# Patient Record
Sex: Female | Born: 1950 | Race: White | Hispanic: No | Marital: Married | Smoking: Current some day smoker
Health system: Southern US, Community
[De-identification: ages and names within clinical notes are randomized; demographics above are authoritative.]

## PROBLEM LIST (undated history)

## (undated) DIAGNOSIS — I10 Essential (primary) hypertension: Secondary | ICD-10-CM

## (undated) DIAGNOSIS — I480 Paroxysmal atrial fibrillation: Secondary | ICD-10-CM

## (undated) DIAGNOSIS — C50919 Malignant neoplasm of unspecified site of unspecified female breast: Secondary | ICD-10-CM

## (undated) HISTORY — PX: ANKLE SURGERY: SHX546

## (undated) HISTORY — PX: LAPAROSCOPIC TUBAL LIGATION: SHX1937

## (undated) HISTORY — PX: DILATION AND CURETTAGE OF UTERUS: SHX78

---

## 1998-11-17 ENCOUNTER — Other Ambulatory Visit: Admission: RE | Admit: 1998-11-17 | Discharge: 1998-11-17 | Payer: Self-pay | Admitting: Obstetrics and Gynecology

## 1999-11-29 ENCOUNTER — Other Ambulatory Visit: Admission: RE | Admit: 1999-11-29 | Discharge: 1999-11-29 | Payer: Self-pay | Admitting: Obstetrics and Gynecology

## 2000-12-06 ENCOUNTER — Other Ambulatory Visit: Admission: RE | Admit: 2000-12-06 | Discharge: 2000-12-06 | Payer: Self-pay | Admitting: *Deleted

## 2001-12-10 ENCOUNTER — Other Ambulatory Visit: Admission: RE | Admit: 2001-12-10 | Discharge: 2001-12-10 | Payer: Self-pay | Admitting: Obstetrics and Gynecology

## 2002-12-11 ENCOUNTER — Other Ambulatory Visit: Admission: RE | Admit: 2002-12-11 | Discharge: 2002-12-11 | Payer: Self-pay | Admitting: Obstetrics and Gynecology

## 2003-12-16 ENCOUNTER — Other Ambulatory Visit: Admission: RE | Admit: 2003-12-16 | Discharge: 2003-12-16 | Payer: Self-pay | Admitting: Obstetrics and Gynecology

## 2007-05-08 ENCOUNTER — Encounter: Admission: RE | Admit: 2007-05-08 | Discharge: 2007-05-08 | Payer: Self-pay | Admitting: Family Medicine

## 2007-05-14 ENCOUNTER — Encounter: Admission: RE | Admit: 2007-05-14 | Discharge: 2007-05-14 | Payer: Self-pay | Admitting: Family Medicine

## 2008-03-15 IMAGING — MG MM DIGITAL SCREENING BILAT W/ CAD
4 series · 4 of 4 positions shown · non-contrast
Comparison: none

DG SCREEN MAMMOGRAM BILATERAL
Bilateral CC and MLO view(s) were taken.
Technologist: Tae Hoon Junichi

DIGITAL SCREENING MAMMOGRAM WITH CAD:
There is a fibroglandular pattern.  A possible mass is noted in the left breast.  Spot compression 
views and possibly sonography are recommended for further evaluation.  In the right breast, no 
masses or malignant type calcifications are identified.  Compared with prior studies.

[R CC]
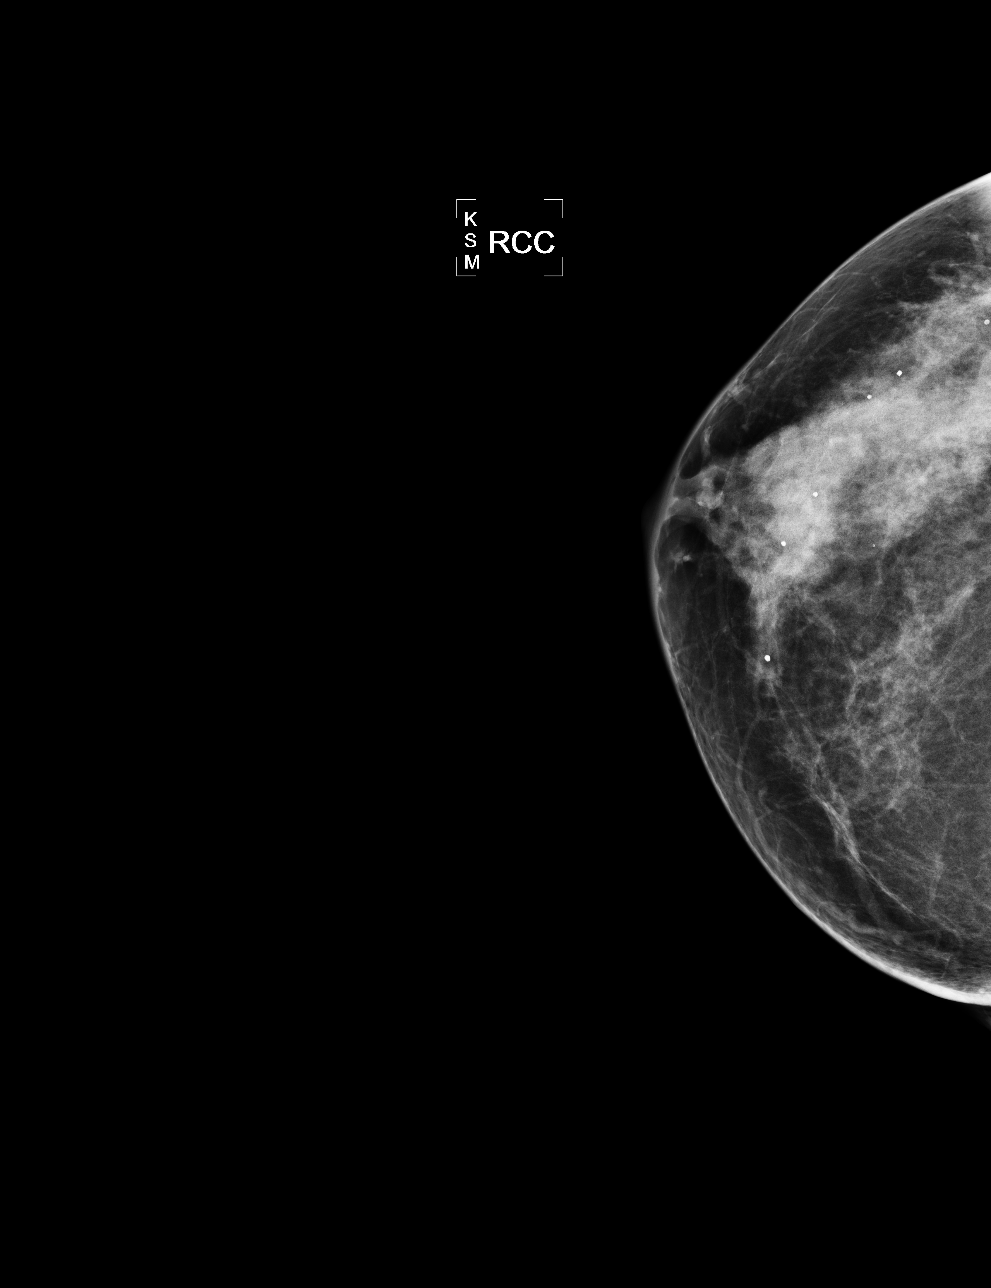

[L CC]
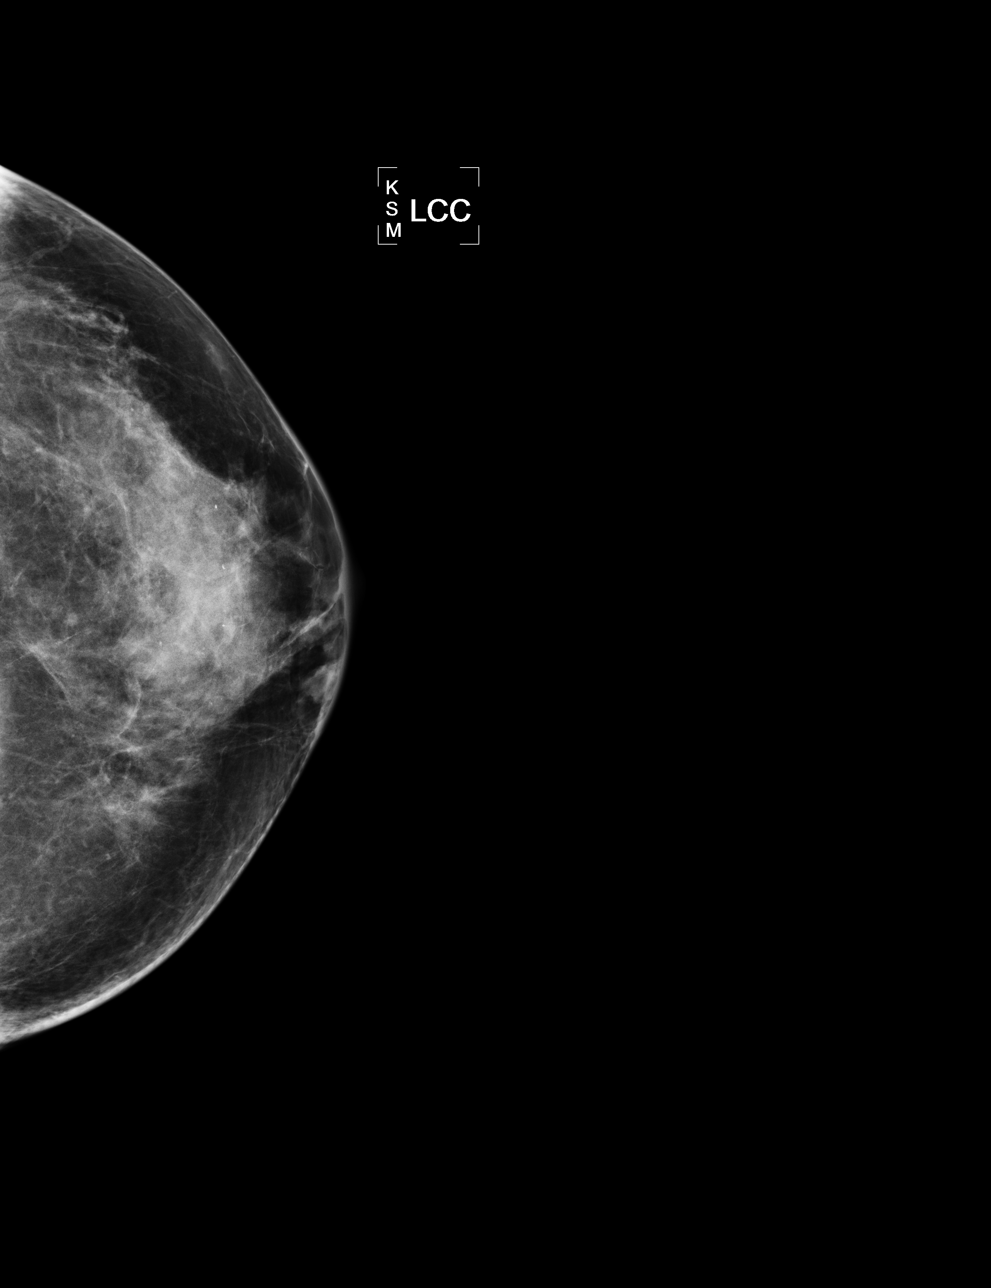

[L MLO]
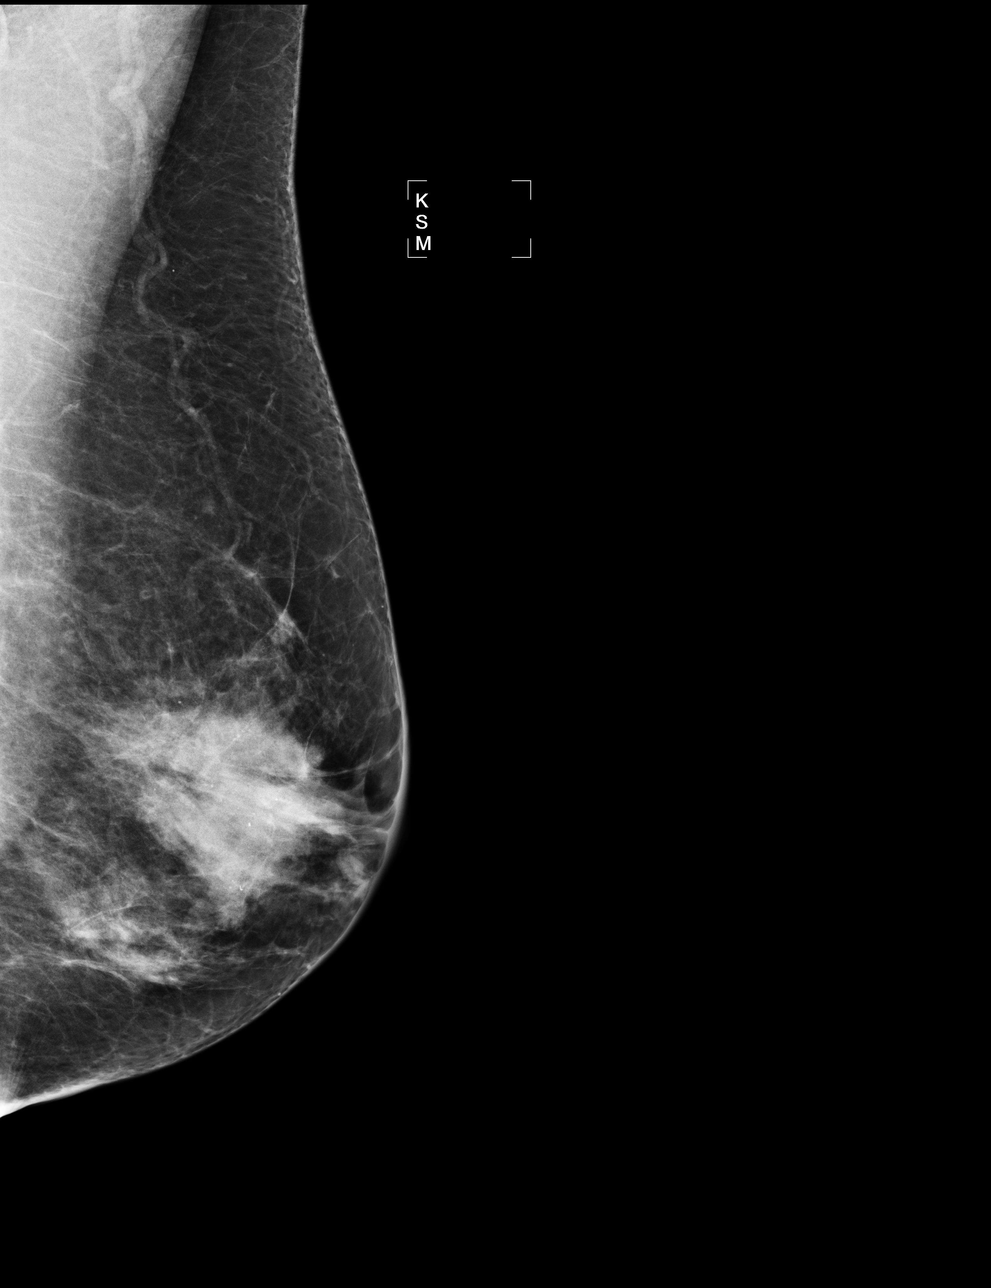

[R MLO]
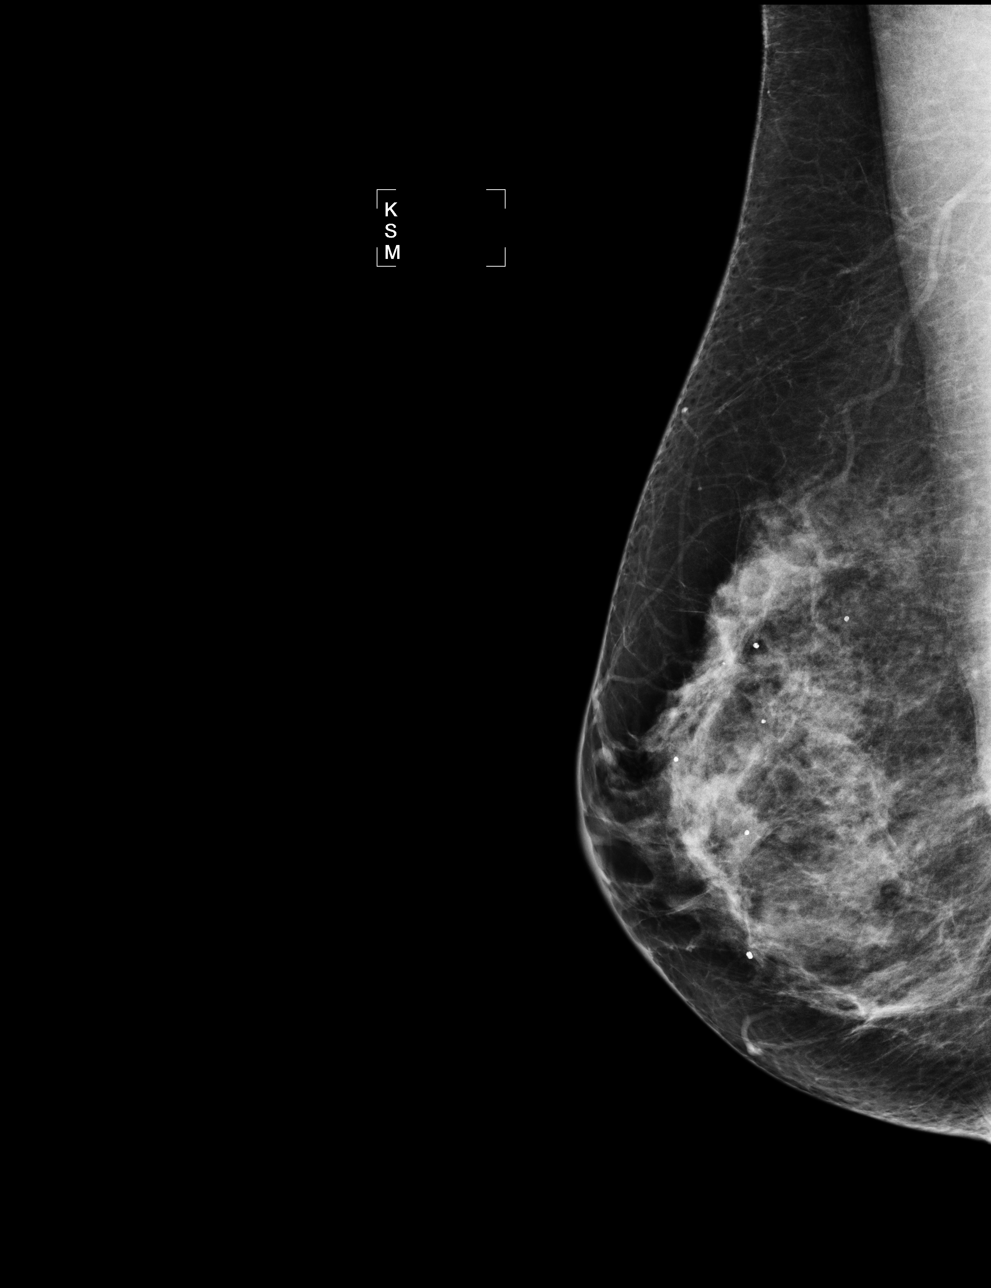

[4 of 4 positions shown; findings below may reference images not displayed]

IMPRESSION: Possible mass, left breast.  Additional evaluation is indicated.  The patient will be contacted for
additional studies and a supplementary report will follow.  No specific mammographic evidence of 
malignancy, right breast. Consider biennial screening breast MRI given the patient's high risk 
status with a strong family history of breast cancer.

ASSESSMENT: Need additional imaging evaluation and/or prior mammograms for comparison - BI-RADS 0

Further imaging of the left breast.
THIS WAS ANALAYZED BY COMPUTER AIDED DETECTION. , THIS PROCEDURE WAS A DIGITAL MAMMOGRAM.

## 2011-01-15 ENCOUNTER — Encounter: Payer: Self-pay | Admitting: Family Medicine

## 2012-01-06 ENCOUNTER — Encounter (HOSPITAL_BASED_OUTPATIENT_CLINIC_OR_DEPARTMENT_OTHER): Payer: Self-pay | Admitting: Respiratory Therapy

## 2012-01-06 ENCOUNTER — Emergency Department (HOSPITAL_BASED_OUTPATIENT_CLINIC_OR_DEPARTMENT_OTHER)
Admission: EM | Admit: 2012-01-06 | Discharge: 2012-01-06 | Disposition: A | Discharge status: Home / Self Care | Payer: Federal, State, Local not specified - PPO | Attending: Emergency Medicine | Admitting: Emergency Medicine

## 2012-01-06 DIAGNOSIS — S61209A Unspecified open wound of unspecified finger without damage to nail, initial encounter: Secondary | ICD-10-CM | POA: Insufficient documentation

## 2012-01-06 DIAGNOSIS — W260XXA Contact with knife, initial encounter: Secondary | ICD-10-CM | POA: Insufficient documentation

## 2012-01-06 DIAGNOSIS — W261XXA Contact with sword or dagger, initial encounter: Secondary | ICD-10-CM | POA: Insufficient documentation

## 2012-01-06 DIAGNOSIS — F17200 Nicotine dependence, unspecified, uncomplicated: Secondary | ICD-10-CM | POA: Insufficient documentation

## 2012-01-06 DIAGNOSIS — Y92009 Unspecified place in unspecified non-institutional (private) residence as the place of occurrence of the external cause: Secondary | ICD-10-CM | POA: Insufficient documentation

## 2012-01-06 DIAGNOSIS — S61012A Laceration without foreign body of left thumb without damage to nail, initial encounter: Secondary | ICD-10-CM

## 2012-01-06 DIAGNOSIS — I10 Essential (primary) hypertension: Secondary | ICD-10-CM | POA: Insufficient documentation

## 2012-01-06 DIAGNOSIS — Y93G1 Activity, food preparation and clean up: Secondary | ICD-10-CM | POA: Insufficient documentation

## 2012-01-06 HISTORY — DX: Essential (primary) hypertension: I10

## 2012-01-06 NOTE — ED Notes (Signed)
Pt states she cut her left thumb about a week ago. Has been applying neosporin and a bandaid, but then noticed increased redness and pain yesterday. Itching as well.

## 2012-01-06 NOTE — ED Provider Notes (Signed)
History  This chart was scribed for Doug Sou, MD by Bennett Scrape. This patient was seen in room MHCT2/MHCT2 and the patient's care was started at 7:34PM.   CSN: [id number]  Arrival date & time 01/06/12  1759   First MD Initiated Contact with Patient 01/06/12 1920      Chief Complaint  Patient presents with  . Hand Pain    Patient is a 61 y.o. female presenting with hand pain. The history is provided by the patient. No language interpreter was used.  Hand Pain This is a new problem. The current episode started yesterday. The problem occurs constantly. The problem has been gradually improving. Pertinent negatives include no chest pain, no abdominal pain, no headaches and no shortness of breath. The symptoms are aggravated by nothing. The symptoms are relieved by nothing.    Tara Lawrence is a 61 y.o. female who presents to the Emergency Department complaining of 24 hours of gradual onset, gradually improving, constant, non-radiating left thumb pain described as a throbbing sensation. Pt states that she cut her left thumb 5 days ago with a knife while spreading butter on a piece of toast. She reports that she noticed increased redness and pain yesterday. She states that she as been applying neosporin and wearing Band-Aids up until yesterday. She states that she has a h/o cellulitis and is afraid that the symptoms may be from cellulitis.  She states that she is unsure of when her last TD shot was. Pt is a smoker and alcohol user.   Past Medical History  Diagnosis Date  . Hypertension     Past Surgical History  Procedure Date  . Dilation and curettage of uterus   . Laparoscopic tubal ligation   . Ankle surgery     History reviewed. No pertinent family history.  History  Substance Use Topics  . Smoking status: Current Some Day Smoker  . Smokeless tobacco: Not on file  . Alcohol Use: Yes    OB History    Grav Para Term Preterm Abortions TAB SAB Ect Mult Living           Review of Systems  Constitutional: Negative for fever and chills.  HENT: Negative for congestion and sore throat.   Respiratory: Negative for cough and shortness of breath.   Cardiovascular: Negative for chest pain.  Gastrointestinal: Negative for nausea, vomiting, abdominal pain and diarrhea.  Skin: Positive for wound (dime sized area of erythema to the left thumb). Negative for rash.  Neurological: Negative for headaches.  All other systems reviewed and are negative.    Allergies  Codeine  Home Medications   Current Outpatient Rx  Name Route Sig Dispense Refill  . CALCIUM 600 + MINERALS 600-200 MG-UNIT PO TABS Oral Take 1 tablet by mouth 2 (two) times daily.    Marland Kitchen METOPROLOL TARTRATE 50 MG PO TABS Oral Take 50 mg by mouth daily.    . ADULT MULTIVITAMIN W/MINERALS CH Oral Take 1 tablet by mouth daily.    Marland Kitchen FISH OIL PO Oral Take 1 capsule by mouth 2 (two) times daily.    Marland Kitchen VITAMIN D (ERGOCALCIFEROL) PO Oral Take 1 capsule by mouth daily.      Triage Vitals: BP 153/80  Pulse 70  Temp 98.2 F (36.8 C)  Resp 18  Ht 5\' 5"  (1.651 m)  Wt 132 lb (59.875 kg)  BMI 21.97 kg/m2  SpO2 100%  Physical Exam  Nursing note and vitals reviewed. Constitutional: She is oriented to person, place,  and time. She appears well-developed and well-nourished.  HENT:  Head: Normocephalic and atraumatic.  Eyes: Conjunctivae and EOM are normal.  Neck: Normal range of motion. Neck supple.  Pulmonary/Chest: Effort normal. No respiratory distress.  Abdominal: She exhibits no distension.  Musculoskeletal: Normal range of motion. She exhibits no edema.  Neurological: She is alert and oriented to person, place, and time. No cranial nerve deficit.  Skin: Skin is warm and dry. No rash noted.       Dime sized area of erythema on the dorsal side of the left thumb,  well healing wound wound to the ventral surface of the left thumb  Psychiatric: She has a normal mood and affect. Her behavior is  normal.    ED Course  Procedures (including critical care time)  DIAGNOSTIC STUDIES: Oxygen Saturation is 100% on room air, normal by my interpretation.    COORDINATION OF CARE: 7:36PM-Advised pt to return if symptoms become worse. Pt opted to have TD done by PCP.   Labs Reviewed - No data to display No results found.   No diagnosis found.    MDM  Doubt cellulitis reddened area is not warm, it is on the opposite side of the laceration, the laceration itself appears well healing and is nontender Plan home observation, local wound care. Patient will have her wound rechecked in 2 days by her private doctor. Tetanus immunization offered the patient, which she declined stating she will check with her private physician as there immmunization status Diagnosis laceration left thumb       Doug Sou, MD 01/06/12 1953

## 2012-01-06 NOTE — ED Provider Notes (Signed)
History     CSN: [id number]  Arrival date & time 01/06/12  1759   First MD Initiated Contact with Patient 01/06/12 1920      Chief Complaint  Patient presents with  . Hand Pain    (Consider location/radiation/quality/duration/timing/severity/associated sxs/prior treatment) HPI  Past Medical History  Diagnosis Date  . Hypertension     Past Surgical History  Procedure Date  . Dilation and curettage of uterus   . Laparoscopic tubal ligation   . Ankle surgery     History reviewed. No pertinent family history.  History  Substance Use Topics  . Smoking status: Current Some Day Smoker  . Smokeless tobacco: Not on file  . Alcohol Use: Yes    OB History    Grav Para Term Preterm Abortions TAB SAB Ect Mult Living                  Review of Systems  Allergies  Codeine  Home Medications   Current Outpatient Rx  Name Route Sig Dispense Refill  . CALCIUM 600 + MINERALS 600-200 MG-UNIT PO TABS Oral Take 1 tablet by mouth 2 (two) times daily.    Marland Kitchen METOPROLOL TARTRATE 50 MG PO TABS Oral Take 50 mg by mouth daily.    . ADULT MULTIVITAMIN W/MINERALS CH Oral Take 1 tablet by mouth daily.    Marland Kitchen FISH OIL PO Oral Take 1 capsule by mouth 2 (two) times daily.    Marland Kitchen VITAMIN D (ERGOCALCIFEROL) PO Oral Take 1 capsule by mouth daily.      BP 153/80  Pulse 70  Temp 98.2 F (36.8 C)  Resp 18  Ht 5\' 5"  (1.651 m)  Wt 132 lb (59.875 kg)  BMI 21.97 kg/m2  SpO2 100%  Physical Exam  ED Course  Procedures (including critical care time)  Labs Reviewed - No data to display No results found.   No diagnosis found.    MDM  Duplicate note        Doug Sou, MD 01/07/12 7829

## 2016-10-14 ENCOUNTER — Inpatient Hospital Stay
Admission: EM | Admit: 2016-10-14 | Discharge: 2016-11-02 | Disposition: A | Discharge status: Home Health | Payer: Federal, State, Local not specified - PPO | Source: Other Acute Inpatient Hospital | Attending: Internal Medicine | Admitting: Internal Medicine

## 2016-10-14 DIAGNOSIS — J90 Pleural effusion, not elsewhere classified: Secondary | ICD-10-CM

## 2016-10-14 DIAGNOSIS — J811 Chronic pulmonary edema: Secondary | ICD-10-CM

## 2016-10-14 DIAGNOSIS — I4891 Unspecified atrial fibrillation: Secondary | ICD-10-CM

## 2016-10-14 DIAGNOSIS — I340 Nonrheumatic mitral (valve) insufficiency: Secondary | ICD-10-CM

## 2016-10-14 DIAGNOSIS — C50919 Malignant neoplasm of unspecified site of unspecified female breast: Secondary | ICD-10-CM

## 2016-10-14 DIAGNOSIS — J9690 Respiratory failure, unspecified, unspecified whether with hypoxia or hypercapnia: Secondary | ICD-10-CM

## 2016-10-14 HISTORY — DX: Paroxysmal atrial fibrillation: I48.0

## 2016-10-14 HISTORY — DX: Malignant neoplasm of unspecified site of unspecified female breast: C50.919

## 2016-10-14 LAB — VITAMIN B12: VITAMIN B 12: 4130 pg/mL — AB (ref 180–914)

## 2016-10-15 ENCOUNTER — Other Ambulatory Visit (HOSPITAL_COMMUNITY): Payer: Federal, State, Local not specified - PPO

## 2016-10-15 LAB — COMPREHENSIVE METABOLIC PANEL
ALT: 13 U/L — AB (ref 14–54)
AST: 23 U/L (ref 15–41)
Albumin: 3.3 g/dL — ABNORMAL LOW (ref 3.5–5.0)
Alkaline Phosphatase: 68 U/L (ref 38–126)
Anion gap: 12 (ref 5–15)
BUN: 13 mg/dL (ref 6–20)
CO2: 29 mmol/L (ref 22–32)
Calcium: 8.8 mg/dL — ABNORMAL LOW (ref 8.9–10.3)
Chloride: 95 mmol/L — ABNORMAL LOW (ref 101–111)
Creatinine, Ser: 0.69 mg/dL (ref 0.44–1.00)
GFR calc Af Amer: >60 mL/min (ref >60)
GFR calc non Af Amer: >60 mL/min (ref >60)
GLUCOSE: 98 mg/dL (ref 65–99)
POTASSIUM: 3.1 mmol/L — AB (ref 3.5–5.1)
Sodium: 136 mmol/L (ref 135–145)
TOTAL PROTEIN: 5.4 g/dL — AB (ref 6.5–8.1)
Total Bilirubin: 1.3 mg/dL — ABNORMAL HIGH (ref 0.3–1.2)

## 2016-10-15 LAB — PROTIME-INR
INR: 1.35
Prothrombin Time: 16.8 seconds — ABNORMAL HIGH (ref 11.4–15.2)

## 2016-10-15 LAB — CBC WITH DIFFERENTIAL/PLATELET
BASOS ABS: 0.1 10*3/uL (ref 0.0–0.1)
BASOS PCT: 1 %
Eosinophils Absolute: 0 10*3/uL (ref 0.0–0.7)
Eosinophils Relative: 0 %
HEMATOCRIT: 32 % — AB (ref 36.0–46.0)
HEMOGLOBIN: 10.6 g/dL — AB (ref 12.0–15.0)
LYMPHS PCT: 7 %
Lymphs Abs: 0.7 10*3/uL (ref 0.7–4.0)
MCH: 34.5 pg — ABNORMAL HIGH (ref 26.0–34.0)
MCHC: 33.1 g/dL (ref 30.0–36.0)
MCV: 104.2 fL — AB (ref 78.0–100.0)
MONO ABS: 0.7 10*3/uL (ref 0.1–1.0)
Monocytes Relative: 8 %
NEUTROS ABS: 8.3 10*3/uL — AB (ref 1.7–7.7)
NEUTROS PCT: 84 %
Platelets: 181 10*3/uL (ref 150–400)
RBC: 3.07 MIL/uL — AB (ref 3.87–5.11)
RDW: 14.1 % (ref 11.5–15.5)
WBC: 9.8 10*3/uL (ref 4.0–10.5)

## 2016-10-15 LAB — URINALYSIS, ROUTINE W REFLEX MICROSCOPIC
Bilirubin Urine: NEGATIVE
Glucose, UA: NEGATIVE mg/dL
HGB URINE DIPSTICK: NEGATIVE
Ketones, ur: NEGATIVE mg/dL
LEUKOCYTES UA: NEGATIVE
NITRITE: NEGATIVE
PROTEIN: NEGATIVE mg/dL
SPECIFIC GRAVITY, URINE: 1.013 (ref 1.005–1.030)
pH: 6 (ref 5.0–8.0)

## 2016-10-15 LAB — PHOSPHORUS: Phosphorus: 4.2 mg/dL (ref 2.5–4.6)

## 2016-10-15 LAB — MAGNESIUM: Magnesium: 1.4 mg/dL — ABNORMAL LOW (ref 1.7–2.4)

## 2016-10-15 LAB — PROCALCITONIN: PROCALCITONIN: 0.25 ng/mL

## 2016-10-15 LAB — TSH: TSH: 13.394 u[IU]/mL — AB (ref 0.350–4.500)

## 2016-10-16 LAB — BASIC METABOLIC PANEL
Anion gap: 10 (ref 5–15)
BUN: 16 mg/dL (ref 6–20)
CALCIUM: 8.7 mg/dL — AB (ref 8.9–10.3)
CHLORIDE: 95 mmol/L — AB (ref 101–111)
CO2: 31 mmol/L (ref 22–32)
CREATININE: 0.67 mg/dL (ref 0.44–1.00)
GFR calc Af Amer: >60 mL/min (ref >60)
GFR calc non Af Amer: >60 mL/min (ref >60)
GLUCOSE: 103 mg/dL — AB (ref 65–99)
Potassium: 3.6 mmol/L (ref 3.5–5.1)
Sodium: 136 mmol/L (ref 135–145)

## 2016-10-16 LAB — CBC WITH DIFFERENTIAL/PLATELET
Basophils Absolute: 0.1 10*3/uL (ref 0.0–0.1)
Basophils Relative: 1 %
Eosinophils Absolute: 0 10*3/uL (ref 0.0–0.7)
Eosinophils Relative: 0 %
HEMATOCRIT: 30.2 % — AB (ref 36.0–46.0)
HEMOGLOBIN: 10 g/dL — AB (ref 12.0–15.0)
LYMPHS ABS: 0.9 10*3/uL (ref 0.7–4.0)
LYMPHS PCT: 10 %
MCH: 34.7 pg — AB (ref 26.0–34.0)
MCHC: 33.1 g/dL (ref 30.0–36.0)
MCV: 104.9 fL — AB (ref 78.0–100.0)
MONO ABS: 0.7 10*3/uL (ref 0.1–1.0)
MONOS PCT: 8 %
NEUTROS ABS: 6.9 10*3/uL (ref 1.7–7.7)
NEUTROS PCT: 81 %
Platelets: 174 10*3/uL (ref 150–400)
RBC: 2.88 MIL/uL — ABNORMAL LOW (ref 3.87–5.11)
RDW: 14 % (ref 11.5–15.5)
WBC: 8.6 10*3/uL (ref 4.0–10.5)

## 2016-10-16 LAB — MAGNESIUM: Magnesium: 2 mg/dL (ref 1.7–2.4)

## 2016-10-16 LAB — T4, FREE: Free T4: 1.01 ng/dL (ref 0.61–1.12)

## 2016-10-16 LAB — HEMOGLOBIN A1C
Hgb A1c MFr Bld: 5.6 % (ref 4.8–5.6)
Mean Plasma Glucose: 114 mg/dL

## 2016-10-16 LAB — TSH: TSH: 17.435 u[IU]/mL — AB (ref 0.350–4.500)

## 2016-10-16 LAB — PHOSPHORUS: PHOSPHORUS: 4.1 mg/dL (ref 2.5–4.6)

## 2016-10-17 ENCOUNTER — Other Ambulatory Visit (HOSPITAL_COMMUNITY): Payer: Federal, State, Local not specified - PPO

## 2016-10-17 LAB — BASIC METABOLIC PANEL
ANION GAP: 8 (ref 5–15)
BUN: 10 mg/dL (ref 6–20)
CALCIUM: 8.7 mg/dL — AB (ref 8.9–10.3)
CO2: 31 mmol/L (ref 22–32)
CREATININE: 0.61 mg/dL (ref 0.44–1.00)
Chloride: 97 mmol/L — ABNORMAL LOW (ref 101–111)
Glucose, Bld: 104 mg/dL — ABNORMAL HIGH (ref 65–99)
Potassium: 3.7 mmol/L (ref 3.5–5.1)
SODIUM: 136 mmol/L (ref 135–145)

## 2016-10-17 LAB — TSH: TSH: 19.125 u[IU]/mL — AB (ref 0.350–4.500)

## 2016-10-17 LAB — MAGNESIUM: MAGNESIUM: 1.6 mg/dL — AB (ref 1.7–2.4)

## 2016-10-17 LAB — CBC
HEMATOCRIT: 31 % — AB (ref 36.0–46.0)
Hemoglobin: 10.4 g/dL — ABNORMAL LOW (ref 12.0–15.0)
MCH: 35.4 pg — ABNORMAL HIGH (ref 26.0–34.0)
MCHC: 33.5 g/dL (ref 30.0–36.0)
MCV: 105.4 fL — ABNORMAL HIGH (ref 78.0–100.0)
Platelets: 168 10*3/uL (ref 150–400)
RBC: 2.94 MIL/uL — ABNORMAL LOW (ref 3.87–5.11)
RDW: 14.2 % (ref 11.5–15.5)
WBC: 8.3 10*3/uL (ref 4.0–10.5)

## 2016-10-17 LAB — URINE CULTURE: CULTURE: NO GROWTH

## 2016-10-17 LAB — T4, FREE: Free T4: 0.94 ng/dL (ref 0.61–1.12)

## 2016-10-17 LAB — PHOSPHORUS: PHOSPHORUS: 3.9 mg/dL (ref 2.5–4.6)

## 2016-10-18 LAB — BASIC METABOLIC PANEL
ANION GAP: 9 (ref 5–15)
BUN: 10 mg/dL (ref 6–20)
CO2: 32 mmol/L (ref 22–32)
Calcium: 8.9 mg/dL (ref 8.9–10.3)
Chloride: 97 mmol/L — ABNORMAL LOW (ref 101–111)
Creatinine, Ser: 0.53 mg/dL (ref 0.44–1.00)
Glucose, Bld: 104 mg/dL — ABNORMAL HIGH (ref 65–99)
POTASSIUM: 3.6 mmol/L (ref 3.5–5.1)
SODIUM: 138 mmol/L (ref 135–145)

## 2016-10-18 LAB — MAGNESIUM: MAGNESIUM: 1.6 mg/dL — AB (ref 1.7–2.4)

## 2016-10-18 LAB — PHOSPHORUS: Phosphorus: 4.2 mg/dL (ref 2.5–4.6)

## 2016-10-19 ENCOUNTER — Encounter: Payer: Self-pay | Admitting: Student

## 2016-10-19 DIAGNOSIS — I4891 Unspecified atrial fibrillation: Secondary | ICD-10-CM

## 2016-10-19 DIAGNOSIS — C50919 Malignant neoplasm of unspecified site of unspecified female breast: Secondary | ICD-10-CM

## 2016-10-19 DIAGNOSIS — J90 Pleural effusion, not elsewhere classified: Secondary | ICD-10-CM

## 2016-10-19 DIAGNOSIS — J9601 Acute respiratory failure with hypoxia: Secondary | ICD-10-CM

## 2016-10-19 DIAGNOSIS — I481 Persistent atrial fibrillation: Secondary | ICD-10-CM

## 2016-10-19 DIAGNOSIS — I340 Nonrheumatic mitral (valve) insufficiency: Secondary | ICD-10-CM

## 2016-10-19 DIAGNOSIS — J9690 Respiratory failure, unspecified, unspecified whether with hypoxia or hypercapnia: Secondary | ICD-10-CM

## 2016-10-19 NOTE — Consult Note (Signed)
Cardiology Consult    Patient ID: [name] MRN: [id number], DOB/AGE: Nov 27, 1951   Admit date: 10/14/2016 Date of Consult: 10/19/2016  Primary Physician: No primary care provider on file. Reason for Consult: Severe Mitral Regurgitation Primary Cardiologist: [facility] Requesting Provider: Dr. Laren Everts  History of Present Illness    Tara Lawrence is a 65 y.o. female with past medical history of HTN, PAF (not on anticoagulation),  severe MR, alcohol abuse, liver disease, right breast cancer (metastases to spine and pelvis), DJD, and GERD who presented to [facility] on 10/07/2016 for continued rehabilitation.   She was recently admitted to [facility] for septic shock with cultures being positive for Pseudomonas aerugionosa thought to be due to an infected tunnel catheter in the left subclavian.Her porta cath was subsequently removed.  Hospital course was complicated by acute respiratory failure secondary to aspiration PNA and bilateral pleural effusions (s/p bilateral thoracentesis). Developed atrial fibrillation with RVR and was continued on Lopressor for rate control. Has since been transferred to [facility] for rehabilitation.   Initial labs on admission showed WBC of 9.8, Hgb 10.6, platelets 181. K+ 3.1. Creatinine 0.69. Mg 1.4. TSH 13.394 with Free T4 at 1.01. EKG shows atrial fibrillation, HR 85, with no acute ST or T-wave changes. CXR shows persistent mild congestion and pulmonary edema with bilateral small pleural effusions and bilateral basilar atelectasis or infiltrate.  She has been diuresing with IV Lasix 40mg  BID. Unclear what her weight has been since admission (patient's husband notes weights have varied from 115 lbs to 145 lbs in the past month.   Was last seen by a Cardiologist 3+ years ago and told she had a "leaky heart valve". Echo in 09/2016 showed a preserved EF of 65-70% with severely dilated LA and severe MR with severe prolapse of  the posterior leaflet of the mitral valve. No history of CAD. No prior cardiac catheterization. No family history of CAD.    Past Medical History   Past Medical History:  Diagnosis Date  . Breast cancer ([place])    a. known mets to spine and pelvis  . Hypertension   . PAF (paroxysmal atrial fibrillation) ([place])     Past Surgical History:  Procedure Laterality Date  . ANKLE SURGERY    . DILATION AND CURETTAGE OF UTERUS    . LAPAROSCOPIC TUBAL LIGATION       Allergies  Allergies  Allergen Reactions  . Codeine Nausea And Vomiting    Inpatient Medications      Family History    Family History  Problem Relation Age of Onset  . Stroke Father   . Cancer Sister     Social History    Social History   Social History  . Marital status: Married    Spouse name: N/A  . Number of children: N/A  . Years of education: N/A   Occupational History  . Not on file.   Social History Main Topics  . Smoking status: Current Some Day Smoker  . Smokeless tobacco: Not on file  . Alcohol use Yes  . Drug use: No  . Sexual activity: Not on file   Other Topics Concern  . Not on file   Social History Narrative  . No narrative on file     Review of Systems    General:  No chills, fever, night sweats or weight changes.  Cardiovascular:  No chest pain, orthopnea, palpitations, paroxysmal nocturnal dyspnea. Positive for dyspnea on exertion  and edema.  Dermatological: No rash, lesions/masses Respiratory: No cough, Positive for dyspnea Urologic: No hematuria, dysuria Abdominal:   No nausea, vomiting, diarrhea, bright red blood per rectum, melena, or hematemesis Neurologic:  No visual changes, wkns, changes in mental status. All other systems reviewed and are otherwise negative except as noted above.  Physical Exam    There were no vitals taken for this visit.  General: Pleasant, frail appearing Caucasian female, currently in NAD. Psych: Normal affect. Neuro: Alert and oriented X  3. Moves all extremities spontaneously. HEENT: Normal  Neck: Supple without bruits or JVD. Lungs:  Resp regular and unlabored, mild rales at bases bilaterally. Heart: Irregularly irregular, no s3, s4, 2/6 holosystolic murmur at Apex. Abdomen: Soft, non-tender, non-distended, BS + x 4.  Extremities: No clubbing or cyanosis. 1+ pitting lower extremity edema bilaterally. DP/PT/Radials 2+ and equal bilaterally.  Labs    Troponin (Point of Care Test) No results for input(s): TROPIPOC in the last 72 hours. No results for input(s): CKTOTAL, CKMB, TROPONINI in the last 72 hours. Lab Results  Component Value Date   WBC 8.3 10/17/2016   HGB 10.4 (L) 10/17/2016   HCT 31.0 (L) 10/17/2016   MCV 105.4 (H) 10/17/2016   PLT 168 10/17/2016    Recent Labs Lab 10/15/16 0553  10/18/16 0610  NA 136  < > 138  K 3.1*  < > 3.6  CL 95*  < > 97*  CO2 29  < > 32  BUN 13  < > 10  CREATININE 0.69  < > 0.53  CALCIUM 8.8*  < > 8.9  PROT 5.4*  --   --   BILITOT 1.3*  --   --   ALKPHOS 68  --   --   ALT 13*  --   --   AST 23  --   --   GLUCOSE 98  < > 104*  < > = values in this interval not displayed. No results found for: CHOL, HDL, LDLCALC, TRIG No results found for: [facility]   Radiology Studies    Dg Chest Port 1 View  Result Date: 10/17/2016 CLINICAL DATA:  Pleural effusion EXAM: PORTABLE CHEST 1 VIEW COMPARISON:  10/15/2016 FINDINGS: Cardiomegaly again noted. Persistent central vascular congestion and mild interstitial edema bilaterally. Stable bilateral pleural effusion with bilateral basilar atelectasis or infiltrate right greater than left. Left arm PICC line is unchanged in position. No pneumothorax. IMPRESSION: Persistent mild congestion and pulmonary edema. Bilateral small pleural effusion with bilateral basilar atelectasis or infiltrate. Electronically Signed   By: Lahoma Crocker M.D.   On: 10/17/2016 08:06   Dg Chest Port 1 View  Result Date: 10/15/2016 CLINICAL DATA:  Respiratory failure,  atrial fibrillation EXAM: PORTABLE CHEST 1 VIEW COMPARISON:  10/13/2016 FINDINGS: Cardiomegaly again noted. Left arm PICC line is unchanged in position. Worsening in aeration. Probable mild interstitial edema bilaterally. Small right pleural effusion with right lower lobe atelectasis or infiltrate. Persistent opacity left perihilar. Small left pleural effusion left basilar atelectasis or infiltrate. IMPRESSION: Worsening in aeration. Probable mild interstitial edema bilaterally. Small right pleural effusion with right lower lobe atelectasis or infiltrate. Persistent opacity left perihilar. Small left pleural effusion left basilar atelectasis or infiltrate. Electronically Signed   By: Lahoma Crocker M.D.   On: 10/15/2016 09:26    EKG & Cardiac Imaging    EKG: Atrial fibrillation, HR 85, with no acute ST or T-wave changes.   Echocardiogram: 10/02/2016 Findings Mitral Valve Severe prolapse of posterior leaflet of mitral  valve. Severe mitral regurgitation. Aortic Valve Structurally normal aortic valve with good leaflet mobility, and no regurgitation. Tricuspid Valve Tricuspid valve is structurally normal. Pulmonic Valve Pulmonic valve is structurally normal. Left Atrium Severely dilated left atrium. Left Ventricle Ejection fraction is visually estimated at 65-70% Right Atrium Normal right atrium. Right Ventricle Normal right ventricular size and function. Pericardial Effusion No evidence of pericardial effusion. Miscellaneous The aorta is within normal limits. The IVC is dilated  Assessment & Plan    1. Severe Mitral Regurgitation - reports being diagnosed with a leaky valve 3+ years ago but has not followed up with Cardiology since. Echo in 09/2016 showed a preserved EF of 65-70% with severely dilated LA and severe MR with severe prolapse of the posterior leaflet of the mitral valve.  - could consider TEE for further evaluation but not sure this would change her course of treatment with  known Stage 4 Breast Cancer and mets to pelvis and spine. If it is decided to pursue a TEE, respiratory status needs to significantly improve prior to this.   2. Acute on Chronic Diastolic CHF - echo from [id number] shows a preserved EF of 65-70%. Does appear volume overloaded on physical exam.  - continue with IV Lasix 40mg  BID. Obtain standing daily weights and I&O's to monitor response to IV diuresis.   3. Paroxysmal Atrial Fibrillation - HR currently controlled in the 70's - 80's on Lopressor 100mg  BID and Cardizem 30mg  Q6H. Consider switching to long-acting Cardizem CD 120mg  daily.  - This patients CHA2DS2-VASc Score and unadjusted Ischemic Stroke Rate (% per year) is equal to 3.2 % stroke rate/year from a score of 3 (HTN, Age, Female). With elevated score, would benefit from long-term anticoagulation. With severe MR, would likely need to be on Coumadin. Would need to address with Oncologist prior to initiating this.   4. Stage 4 Breast Cancer - known metastases to spine and pelvis. Diagnosed in Spring 2017. - currently undergoing chemotherapy prior to admission.  Followed by Dr. Chancy Milroy as an outpatient.   5. Hypothyroidism - labs on admission showed TSH of 13.394 with Free T4 at 1.01. - currently on Synthroid. Per admitting team.    Signed, Erma Heritage, PA-C 10/19/2016, 4:29 PM Pager: [phone]

## 2016-10-20 ENCOUNTER — Other Ambulatory Visit (HOSPITAL_COMMUNITY): Payer: Federal, State, Local not specified - PPO

## 2016-10-20 ENCOUNTER — Other Ambulatory Visit (HOSPITAL_BASED_OUTPATIENT_CLINIC_OR_DEPARTMENT_OTHER): Payer: Federal, State, Local not specified - PPO

## 2016-10-20 DIAGNOSIS — I340 Nonrheumatic mitral (valve) insufficiency: Secondary | ICD-10-CM | POA: Diagnosis not present

## 2016-10-20 LAB — CBC
HCT: 32 % — ABNORMAL LOW (ref 36.0–46.0)
Hemoglobin: 10.7 g/dL — ABNORMAL LOW (ref 12.0–15.0)
MCH: 35.4 pg — AB (ref 26.0–34.0)
MCHC: 33.4 g/dL (ref 30.0–36.0)
MCV: 106 fL — AB (ref 78.0–100.0)
PLATELETS: 162 10*3/uL (ref 150–400)
RBC: 3.02 MIL/uL — AB (ref 3.87–5.11)
RDW: 14.4 % (ref 11.5–15.5)
WBC: 8.3 10*3/uL (ref 4.0–10.5)

## 2016-10-20 LAB — ECHOCARDIOGRAM COMPLETE
AVLVOTPG: 1 mmHg
CHL CUP DOP CALC LVOT VTI: 7 cm
CHL CUP STROKE VOLUME: 58 mL
E/e' ratio: 12.58
EWDT: 185 ms
FS: 34 % (ref 28–44)
IVS/LV PW RATIO, ED: 0.7
LA ID, A-P, ES: 48 mm
LA diam end sys: 48 mm
LA vol: 102 mL
LADIAMINDEX: 2.89 cm/m2
LAVOLA4C: 91.6 mL
LAVOLIN: 61.5 mL/m2
LV E/e' medial: 12.58
LV E/e'average: 12.58
LV SIMPSON'S DISK: 56
LV TDI E'LATERAL: 12.4
LV TDI E'MEDIAL: 5.77
LV dias vol index: 62 mL/m2
LV dias vol: 103 mL (ref 46–106)
LV sys vol: 45 mL — AB (ref 14–42)
LVELAT: 12.4 cm/s
LVOT area: 2.54 cm2
LVOT peak vel: 59.1 cm/s
LVOTD: 18 mm
LVOTSV: 18 mL
LVSYSVOLIN: 27 mL/m2
Lateral S' vel: 5.77 cm/s
MRPISAEROA: 0.93 cm2
MV Dec: 185
MV Peak grad: 10 mmHg
MV VTI: 106 cm
MVPKEVEL: 156 m/s
PV Reg vel dias: 85.5 cm/s
PW: 10 mm — AB (ref 0.6–1.1)
RV TAPSE: 16.5 mm
RV sys press: 47 mmHg
Reg peak vel: 281 cm/s
TRMAXVEL: 281 cm/s

## 2016-10-20 LAB — CULTURE, BLOOD (ROUTINE X 2)
CULTURE: NO GROWTH
Culture: NO GROWTH

## 2016-10-20 LAB — COMPREHENSIVE METABOLIC PANEL
ALK PHOS: 79 U/L (ref 38–126)
ALT: 18 U/L (ref 14–54)
AST: 39 U/L (ref 15–41)
Albumin: 2.7 g/dL — ABNORMAL LOW (ref 3.5–5.0)
Anion gap: 9 (ref 5–15)
BUN: 7 mg/dL (ref 6–20)
CALCIUM: 8.8 mg/dL — AB (ref 8.9–10.3)
CHLORIDE: 93 mmol/L — AB (ref 101–111)
CO2: 34 mmol/L — AB (ref 22–32)
CREATININE: 0.68 mg/dL (ref 0.44–1.00)
GFR calc non Af Amer: >60 mL/min (ref >60)
GLUCOSE: 126 mg/dL — AB (ref 65–99)
Potassium: 3.3 mmol/L — ABNORMAL LOW (ref 3.5–5.1)
SODIUM: 136 mmol/L (ref 135–145)
Total Bilirubin: 0.9 mg/dL (ref 0.3–1.2)
Total Protein: 5.3 g/dL — ABNORMAL LOW (ref 6.5–8.1)

## 2016-10-20 NOTE — Progress Notes (Signed)
Echocardiogram 2D Echocardiogram has been performed.  Aggie Cosier 10/20/2016, 12:53 PM

## 2016-10-21 LAB — BASIC METABOLIC PANEL
Anion gap: 9 (ref 5–15)
BUN: 7 mg/dL (ref 6–20)
CO2: 33 mmol/L — ABNORMAL HIGH (ref 22–32)
Calcium: 8.6 mg/dL — ABNORMAL LOW (ref 8.9–10.3)
Chloride: 94 mmol/L — ABNORMAL LOW (ref 101–111)
Creatinine, Ser: 0.65 mg/dL (ref 0.44–1.00)
GFR calc Af Amer: >60 mL/min (ref >60)
GFR calc non Af Amer: >60 mL/min (ref >60)
Glucose, Bld: 102 mg/dL — ABNORMAL HIGH (ref 65–99)
Potassium: 3.8 mmol/L (ref 3.5–5.1)
Sodium: 136 mmol/L (ref 135–145)

## 2016-10-22 LAB — BASIC METABOLIC PANEL
ANION GAP: 8 (ref 5–15)
BUN: 7 mg/dL (ref 6–20)
CALCIUM: 9.1 mg/dL (ref 8.9–10.3)
CO2: 33 mmol/L — ABNORMAL HIGH (ref 22–32)
CREATININE: 0.65 mg/dL (ref 0.44–1.00)
Chloride: 93 mmol/L — ABNORMAL LOW (ref 101–111)
GLUCOSE: 118 mg/dL — AB (ref 65–99)
Potassium: 3.9 mmol/L (ref 3.5–5.1)
Sodium: 134 mmol/L — ABNORMAL LOW (ref 135–145)

## 2016-10-23 ENCOUNTER — Other Ambulatory Visit (HOSPITAL_COMMUNITY): Payer: Federal, State, Local not specified - PPO

## 2016-10-23 DIAGNOSIS — I480 Paroxysmal atrial fibrillation: Secondary | ICD-10-CM

## 2016-10-23 LAB — BASIC METABOLIC PANEL
Anion gap: 9 (ref 5–15)
Anion gap: 4 — ABNORMAL LOW (ref 5–15)
BUN: 9 mg/dL (ref 6–20)
BUN: 8 mg/dL (ref 6–20)
CALCIUM: 8.7 mg/dL — AB (ref 8.9–10.3)
CALCIUM: 7.5 mg/dL — AB (ref 8.9–10.3)
CO2: 30 mmol/L (ref 22–32)
CO2: 28 mmol/L (ref 22–32)
CREATININE: 0.5 mg/dL (ref 0.44–1.00)
Chloride: 95 mmol/L — ABNORMAL LOW (ref 101–111)
Chloride: 102 mmol/L (ref 101–111)
Creatinine, Ser: 0.63 mg/dL (ref 0.44–1.00)
GFR calc Af Amer: >60 mL/min (ref >60)
GFR calc Af Amer: >60 mL/min (ref >60)
GLUCOSE: 71 mg/dL (ref 65–99)
Glucose, Bld: 96 mg/dL (ref 65–99)
POTASSIUM: 3.6 mmol/L (ref 3.5–5.1)
POTASSIUM: 3.7 mmol/L (ref 3.5–5.1)
SODIUM: 134 mmol/L — AB (ref 135–145)
SODIUM: 134 mmol/L — AB (ref 135–145)

## 2016-10-23 NOTE — Progress Notes (Signed)
    Subjective:  Denies CP or dyspnea   Objective:  121/61 7897.1  Physical Exam: Physical exam: Well-developed frai; chronically ill appearing Skin is warm and dry.  HEENT alopecia Neck is supple.  Chest with diminished BS bases Cardiovascular exam is irregular, 3/6 systolic murmur apex Abdominal exam nontender or distended. No masses palpated. Extremities show 1+ ankle edema. neuro grossly intact    Lab Results: Basic Metabolic Panel:  Recent Labs  10/22/16 0620 10/23/16 0615  NA 134* 134*  K 3.9 3.6  CL 93* 95*  CO2 33* 30  GLUCOSE 118* 96  BUN 7 9  CREATININE 0.65 0.63  CALCIUM 9.1 8.7*     Assessment/Plan:  1 Atrial fibrillation-pt remains in atrial fibrillation; rate controlled; continue metoprolol; change cardizem to CD; embolic risk factors of HTN, female sex and age >59; CHADSvasc 3; would DC lovenox and begin apixaban 5 BID when ok with primary service.  2 Severe MR-volume status much improved; continue present dose of lasix; given metastatic breast cancer, she is not a candidate for surgical repair; favor medical therapy; can consider mitraclip for refractory symptoms.  3 Acute diastolic CHF-continue present dose of lasix and follow renal function.  4 metastatic breast cancer   Tara Lawrence 10/23/2016, 9:01 AM

## 2016-10-24 LAB — CBC
HEMATOCRIT: 30.4 % — AB (ref 36.0–46.0)
HEMOGLOBIN: 10.1 g/dL — AB (ref 12.0–15.0)
MCH: 34.9 pg — ABNORMAL HIGH (ref 26.0–34.0)
MCHC: 33.2 g/dL (ref 30.0–36.0)
MCV: 105.2 fL — ABNORMAL HIGH (ref 78.0–100.0)
Platelets: 174 10*3/uL (ref 150–400)
RBC: 2.89 MIL/uL — AB (ref 3.87–5.11)
RDW: 14.2 % (ref 11.5–15.5)
WBC: 6.9 10*3/uL (ref 4.0–10.5)

## 2016-10-24 LAB — BASIC METABOLIC PANEL
ANION GAP: 7 (ref 5–15)
BUN: 8 mg/dL (ref 6–20)
CHLORIDE: 97 mmol/L — AB (ref 101–111)
CO2: 32 mmol/L (ref 22–32)
Calcium: 8.9 mg/dL (ref 8.9–10.3)
Creatinine, Ser: 0.6 mg/dL (ref 0.44–1.00)
GFR calc non Af Amer: >60 mL/min (ref >60)
Glucose, Bld: 97 mg/dL (ref 65–99)
POTASSIUM: 3.8 mmol/L (ref 3.5–5.1)
Sodium: 136 mmol/L (ref 135–145)

## 2016-10-24 LAB — PHOSPHORUS: PHOSPHORUS: 4.8 mg/dL — AB (ref 2.5–4.6)

## 2016-10-24 LAB — MAGNESIUM: MAGNESIUM: 1.8 mg/dL (ref 1.7–2.4)

## 2016-10-25 NOTE — Progress Notes (Signed)
    Subjective:  Denies CP or dyspnea   Objective:  121/61 7897.1  Physical Exam: Physical exam: Well-developed frail; chronically ill appearing Skin is warm and dry.  HEENT alopecia Neck is supple.  Chest with diminished BS bases (L>R) Cardiovascular exam is irregular, 3/6 systolic murmur apex Abdominal exam nontender or distended. No masses palpated. Extremities show no edema. neuro grossly intact    Lab Results: Basic Metabolic Panel:  Recent Labs  10/23/16 1030 10/24/16 0636  NA 134* 136  K 3.7 3.8  CL 102 97*  CO2 28 32  GLUCOSE 71 97  BUN 8 8  CREATININE 0.50 0.60  CALCIUM 7.5* 8.9  MG  --  1.8  PHOS  --  4.8*     Assessment/Plan:  1 Atrial fibrillation-pt remains in atrial fibrillation; rate mildly elevated; continue metoprolol; change cardizem to 99991111 CD; embolic risk factors of HTN, female sex and age >1; CHADSvasc 3; would begin apixaban 5 BID when ok with primary service.  2 Severe MR-volume status much improved; change lasix to 40 BID PO; given metastatic breast cancer, she is not a candidate for surgical repair; favor medical therapy; can consider mitraclip for refractory symptoms.  3 Acute diastolic CHF-change lasix to po as described.  4 metastatic breast cancer   Kirk Ruths 10/25/2016, 8:55 AM

## 2016-10-26 LAB — MAGNESIUM: Magnesium: 1.8 mg/dL (ref 1.7–2.4)

## 2016-10-26 LAB — CBC WITH DIFFERENTIAL/PLATELET
BASOS PCT: 1 %
Basophils Absolute: 0.1 10*3/uL (ref 0.0–0.1)
EOS ABS: 0.3 10*3/uL (ref 0.0–0.7)
Eosinophils Relative: 5 %
HEMATOCRIT: 28.4 % — AB (ref 36.0–46.0)
HEMOGLOBIN: 9.5 g/dL — AB (ref 12.0–15.0)
LYMPHS ABS: 1.1 10*3/uL (ref 0.7–4.0)
Lymphocytes Relative: 17 %
MCH: 34.8 pg — AB (ref 26.0–34.0)
MCHC: 33.5 g/dL (ref 30.0–36.0)
MCV: 104 fL — ABNORMAL HIGH (ref 78.0–100.0)
MONO ABS: 0.9 10*3/uL (ref 0.1–1.0)
MONOS PCT: 13 %
NEUTROS PCT: 64 %
Neutro Abs: 4.1 10*3/uL (ref 1.7–7.7)
Platelets: 212 10*3/uL (ref 150–400)
RBC: 2.73 MIL/uL — ABNORMAL LOW (ref 3.87–5.11)
RDW: 14 % (ref 11.5–15.5)
WBC: 6.4 10*3/uL (ref 4.0–10.5)

## 2016-10-26 LAB — PHOSPHORUS: Phosphorus: 4.8 mg/dL — ABNORMAL HIGH (ref 2.5–4.6)

## 2016-10-26 LAB — BASIC METABOLIC PANEL
Anion gap: 7 (ref 5–15)
BUN: 7 mg/dL (ref 6–20)
CALCIUM: 9 mg/dL (ref 8.9–10.3)
CHLORIDE: 97 mmol/L — AB (ref 101–111)
CO2: 33 mmol/L — AB (ref 22–32)
CREATININE: 0.64 mg/dL (ref 0.44–1.00)
GFR calc Af Amer: >60 mL/min (ref >60)
GFR calc non Af Amer: >60 mL/min (ref >60)
Glucose, Bld: 94 mg/dL (ref 65–99)
Potassium: 4 mmol/L (ref 3.5–5.1)
SODIUM: 137 mmol/L (ref 135–145)

## 2016-10-27 NOTE — Progress Notes (Signed)
    Subjective:  Denies CP or dyspnea   Objective:  121/61 7897.1  Physical Exam: Physical exam: Well-developed frail; chronically ill appearing Skin is warm and dry.  HEENT alopecia Neck is supple.  Chest with diminished BS bases Cardiovascular exam is irregular, 3/6 systolic murmur apex Abdominal exam nontender or distended. No masses palpated. Extremities show no edema. neuro grossly intact    Lab Results: Basic Metabolic Panel:  Recent Labs  10/26/16 0702  NA 137  K 4.0  CL 97*  CO2 33*  GLUCOSE 94  BUN 7  CREATININE 0.64  CALCIUM 9.0  MG 1.8  PHOS 4.8*     Assessment/Plan:  1 Atrial fibrillation-pt remains in atrial fibrillation; continue cardizem 180 CD and metoprolol; embolic risk factors of HTN, female sex and age >66; CHADSvasc 3; would begin apixaban 5 BID.  2 Severe MR-volume status much improved; continue lasix; given metastatic breast cancer, she is not a candidate for surgical repair; favor medical therapy; can consider mitraclip for refractory symptoms.  3 Acute diastolic CHF-continue lasix to po as described.  4 metastatic breast cancer   Pt can be DCed from a cardiac standpoint and fu with her cardiologist in [facility]. Check hgb and renal function in 4 weeks; please call with questions.  Kirk Ruths 10/27/2016, 9:13 AM

## 2016-10-30 DIAGNOSIS — I482 Chronic atrial fibrillation: Secondary | ICD-10-CM

## 2016-10-30 NOTE — Progress Notes (Signed)
   Patient Name: Tara Lawrence Date of Encounter: 10/30/2016  Primary Cardiologist: None/Will establish in [facility] Problem List     Active Problems:   A-fib [facility])   Pleural effusion   Respiratory failure (HCC)   Severe mitral regurgitation   Malignant neoplasm of breast, stage 4 (HCC)     Subjective   Husband at bedside. Patient has no cardiac complaints. Mild dyspnea with activity.  Inpatient Medications    Scheduled Meds:  Continuous Infusions:  PRN Meds:    Vital Signs    BP 110/70 mmHg, heart rate 90 bpm with atrial fibrillation, respiratory rate 16 breaths per minute.   Physical Exam   Hair loss related to chemotherapy. GEN: Frail-appearing well developed, in no acute distress.  HEENT: Grossly normal.  Neck: Supple, no JVD, carotid bruits, or masses. Cardiac: RRR, no murmurs, rubs, or gallops. No clubbing, cyanosis, edema.  Radials/DP/PT 2+ and equal bilaterally.  Respiratory:  Respirations regular and unlabored, clear to auscultation bilaterally. GI: Soft, nontender, nondistended, BS + x 4. MS: no deformity or atrophy. Skin: warm and dry, no rash. Neuro:  Strength and sensation are intact. Psych: AAOx3.  Normal affect.  Labs    None  Telemetry    Atrial fibrillation with moderate rate control - Personally Reviewed  ECG    10/14/16, reveals atrial fibrillation with controlled rate in no acute ST-T wave change. - Personally Reviewed  Radiology    No results found.  Cardiac Studies   Echocardiogram: 10/20/16 Study Conclusions  - Left ventricle: The cavity size was normal. Wall thickness was   normal. Systolic function was normal. The estimated ejection   fraction was in the range of 50% to 55%. - Mitral valve: Moderate, holosystolicprolapse, involving the   posterior leaflet. There was moderate to severe regurgitation. - Left atrium: The atrium was severely dilated. - Right atrium: The atrium was moderately to severely  dilated. - Tricuspid valve: There was moderate regurgitation. - Pulmonary arteries: Systolic pressure was moderately increased.   PA peak pressure: 47 mm Hg (S). - Pericardium, extracardiac: There was a left pleural effusion.  Patient Profile     65 year old with stage IV metastatic breast cancer, currently on chemotherapy, who developed sepsis/shock from Pseudomonas, acute respiratory failure, chronic atrial fibrillation, and known history of mitral prolapse with regurgitation. Most recent echo reveals moderate to severe mitral regurgitation, LVEF 55-60%. No vegetation mentioned as being present on the [facility] or [place] echo was done within the past month.  Assessment & Plan    1. Chronic atrial fibrillation, now under better control with AV node blocking therapy. 2. Severe mitral regurgitation in patient with known history of mitral valve prolapse. Regurgitation is felt secondary to MVP but in the setting of sepsis, endocarditis needs to be a consideration. TEE has not been performed. If recurrent bacteremia or clinical features of embolic disease, mitral endocarditis should be considered. Not currently felt to be a candidate for mitral valve surgery. Ultimate management will need to be based upon prognosis related to metastatic breast cancer. 3. Stage IV breast cancer, status post bilateral mastectomy. Currently on chemotherapy.  Signed, Sinclair Grooms, MD  10/30/2016, 10:51 AM

## 2016-10-31 NOTE — Progress Notes (Signed)
Patient Name: Tara Lawrence Date of Encounter: 10/31/2016  Primary Cardiologist: [facility] Problem List     Active Problems:   A-fib [facility])   Pleural effusion   Respiratory failure (HCC)   Severe mitral regurgitation   Malignant neoplasm of breast, stage 4 (HCC)     Subjective   She denies cardiac complaints  Inpatient Medications    Scheduled Meds:  Continuous Infusions:  PRN Meds:    Vital Signs    There were no vitals filed for this visit. No intake or output data in the 24 hours ending 10/31/16 1143 There were no vitals filed for this visit.  Physical Exam    GEN: Chronically ill appearing. No distress HEENT: Grossly normal.  Neck: Supple, no JVD, carotid bruits, or masses. Cardiac: RRR, no murmurs, rubs, or gallops. No clubbing, cyanosis, edema.  Radials/DP/PT 2+ and equal bilaterally.  Respiratory:  Respirations regular and unlabored, clear to auscultation bilaterally. GI: Soft, nontender, nondistended, BS + x 4. MS: no deformity or atrophy. Skin: warm and dry, no rash. Neuro:  Strength and sensation are intact. Psych: AAOx3.  Normal affect.  Labs    CBC No results for input(s): WBC, NEUTROABS, HGB, HCT, MCV, PLT in the last 72 hours. Basic Metabolic Panel No results for input(s): NA, K, CL, CO2, GLUCOSE, BUN, CREATININE, CALCIUM, MG, PHOS in the last 72 hours. Liver Function Tests No results for input(s): AST, ALT, ALKPHOS, BILITOT, PROT, ALBUMIN in the last 72 hours. No results for input(s): LIPASE, AMYLASE in the last 72 hours. Cardiac Enzymes No results for input(s): CKTOTAL, CKMB, CKMBINDEX, TROPONINI in the last 72 hours. BNP Invalid input(s): POCBNP D-Dimer No results for input(s): DDIMER in the last 72 hours. Hemoglobin A1C No results for input(s): HGBA1C in the last 72 hours. Fasting Lipid Panel No results for input(s): CHOL, HDL, LDLCALC, TRIG, CHOLHDL, LDLDIRECT in the last 72 hours. Thyroid  Function Tests No results for input(s): TSH, T4TOTAL, T3FREE, THYROIDAB in the last 72 hours.  Invalid input(s): FREET3  Telemetry    Atrial fibrillation with moderate rate control at rates between 90 and 95 bpm - Personally Reviewed  ECG    No new tracing - Personally Reviewed  Radiology    No results found.  Cardiac Studies   No new data compared to yesterday's note  Patient Profile     65 year old with stage IV metastatic breast cancer, currently on chemotherapy, who developed sepsis/shock from Pseudomonas, acute respiratory failure, chronic atrial fibrillation, and known history of mitral prolapse with regurgitation. Most recent echo reveals moderate to severe mitral regurgitation, LVEF 55-60%. No vegetation mentioned as being present on the [facility] or [place] echo was done within the past month.  Assessment & Plan    1. Chronic atrial fibrillation, now under better control with AV node blocking therapy.No change in therapy made at this time. 2. Severe mitral regurgitation in patient with known history of mitral valve prolapse. Regurgitation is felt secondary to MVP but in the setting of sepsis, endocarditis needs to be a consideration. TEE has not been performed. If recurrent bacteremia or clinical features of embolic disease, mitral endocarditis should be considered. Not currently felt to be a candidate for mitral valve surgery. Ultimate management will need to be based upon prognosis related to metastatic breast cancer and coordinated between her oncologist and cardiologist in [facility]. 3. Stage IV breast cancer, status post bilateral mastectomy. Currently on chemotherapy.  Please call if  we may be of further assistance.  Signed, Sinclair Grooms, MD  10/31/2016, 11:43 AM

## 2016-11-01 LAB — BASIC METABOLIC PANEL
ANION GAP: 11 (ref 5–15)
BUN: <5 mg/dL — ABNORMAL LOW (ref 6–20)
CHLORIDE: 98 mmol/L — AB (ref 101–111)
CO2: 28 mmol/L (ref 22–32)
CREATININE: 0.67 mg/dL (ref 0.44–1.00)
Calcium: 9.5 mg/dL (ref 8.9–10.3)
GFR calc non Af Amer: >60 mL/min (ref >60)
Glucose, Bld: 105 mg/dL — ABNORMAL HIGH (ref 65–99)
POTASSIUM: 3.4 mmol/L — AB (ref 3.5–5.1)
SODIUM: 137 mmol/L (ref 135–145)

## 2016-11-01 LAB — CBC WITH DIFFERENTIAL/PLATELET
BASOS ABS: 0.1 10*3/uL (ref 0.0–0.1)
BASOS PCT: 2 %
EOS ABS: 0.4 10*3/uL (ref 0.0–0.7)
Eosinophils Relative: 6 %
HCT: 33.4 % — ABNORMAL LOW (ref 36.0–46.0)
HEMOGLOBIN: 11 g/dL — AB (ref 12.0–15.0)
Lymphocytes Relative: 24 %
Lymphs Abs: 1.4 10*3/uL (ref 0.7–4.0)
MCH: 33.8 pg (ref 26.0–34.0)
MCHC: 32.9 g/dL (ref 30.0–36.0)
MCV: 102.8 fL — ABNORMAL HIGH (ref 78.0–100.0)
Monocytes Absolute: 0.7 10*3/uL (ref 0.1–1.0)
Monocytes Relative: 13 %
NEUTROS PCT: 55 %
Neutro Abs: 3.3 10*3/uL (ref 1.7–7.7)
Platelets: 311 10*3/uL (ref 150–400)
RBC: 3.25 MIL/uL — AB (ref 3.87–5.11)
RDW: 13.4 % (ref 11.5–15.5)
WBC: 5.9 10*3/uL (ref 4.0–10.5)

## 2016-11-01 LAB — PHOSPHORUS: PHOSPHORUS: 5.1 mg/dL — AB (ref 2.5–4.6)

## 2016-11-01 LAB — MAGNESIUM: MAGNESIUM: 1.7 mg/dL (ref 1.7–2.4)

## 2017-08-23 IMAGING — CR DG CHEST 1V PORT
1 series · 1 of 1 positions shown · non-contrast
Comparison: 10/13/2016

CLINICAL DATA: Respiratory failure, atrial fibrillation

EXAM:
PORTABLE CHEST 1 VIEW

[AP]
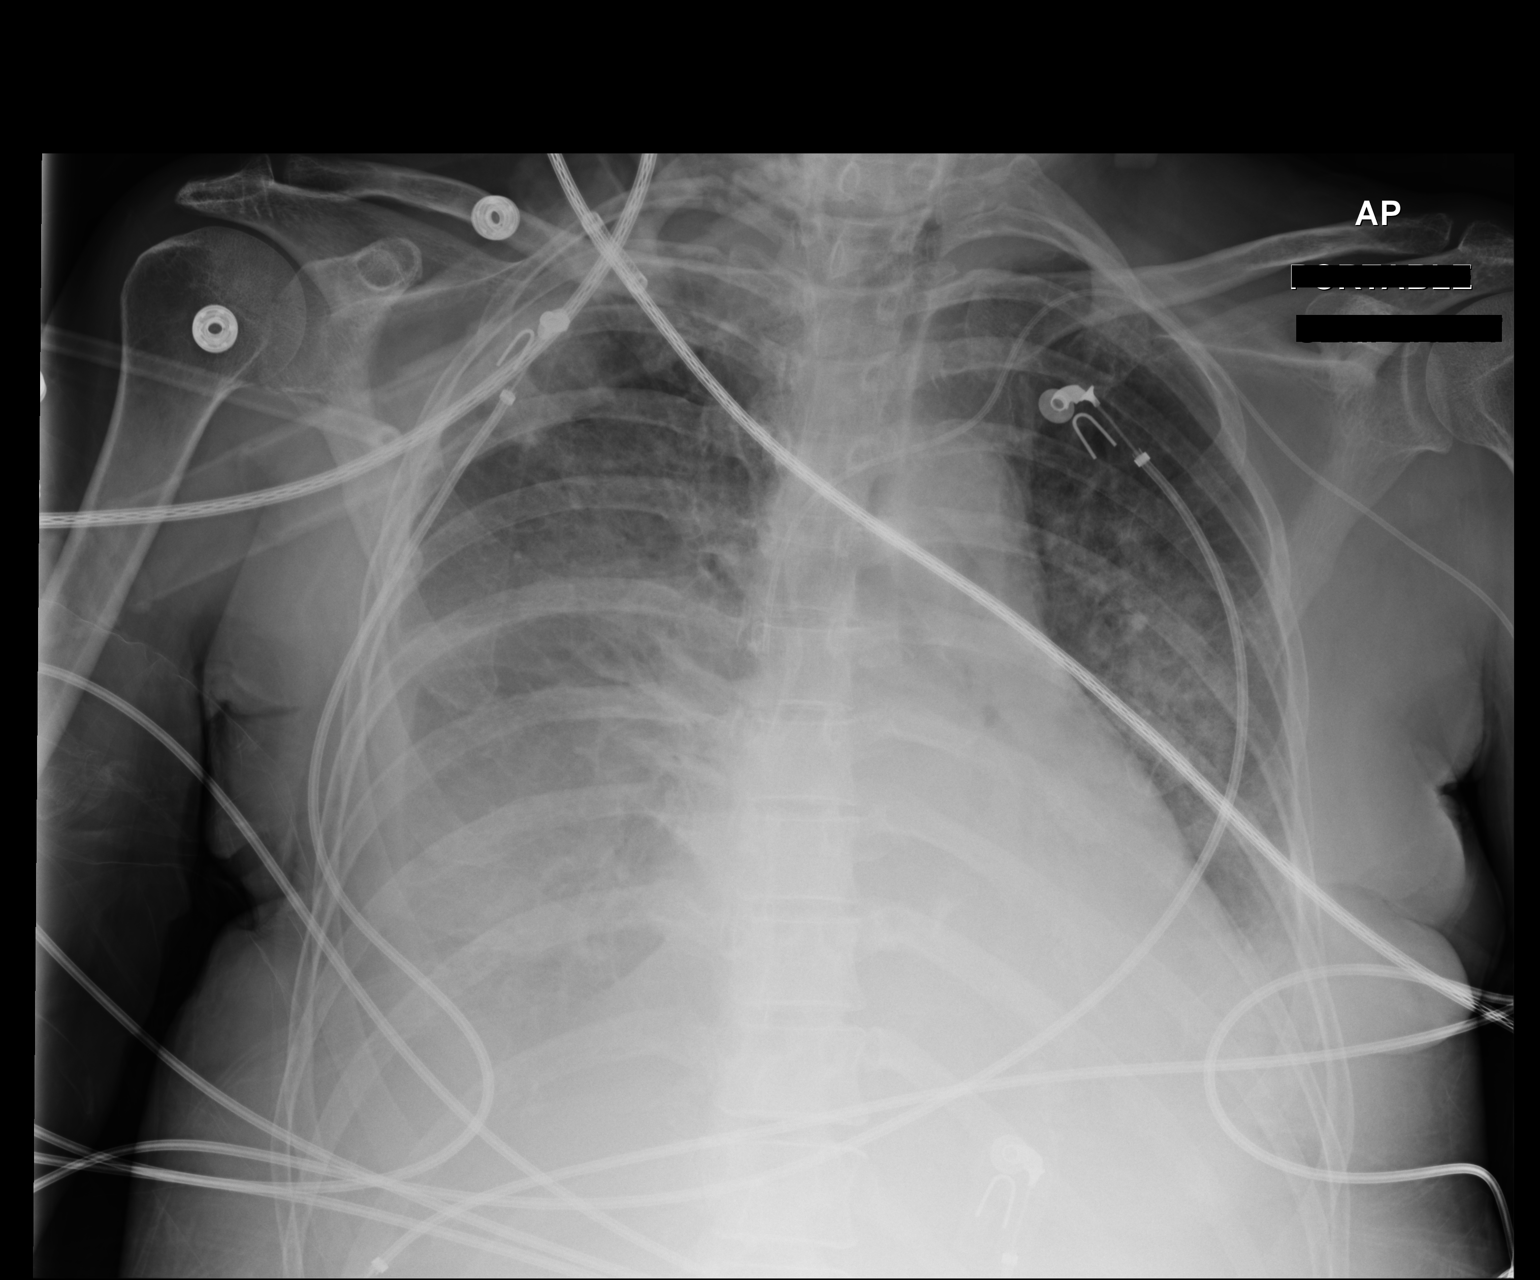

[1 of 1 positions shown; findings below may reference images not displayed]

FINDINGS: Cardiomegaly again noted. Left arm PICC line is unchanged in
position. Worsening in aeration. Probable mild interstitial edema
bilaterally. Small right pleural effusion with right lower lobe
atelectasis or infiltrate. Persistent opacity left perihilar. Small
left pleural effusion left basilar atelectasis or infiltrate.
IMPRESSION: Worsening in aeration. Probable mild interstitial edema bilaterally.
Small right pleural effusion with right lower lobe atelectasis or
infiltrate. Persistent opacity left perihilar. Small left pleural
effusion left basilar atelectasis or infiltrate.

## 2017-08-25 IMAGING — DX DG CHEST 1V PORT
1 series · 1 of 1 positions shown · non-contrast
Comparison: 10/15/2016

CLINICAL DATA: Pleural effusion

EXAM:
PORTABLE CHEST 1 VIEW

[chest ap]
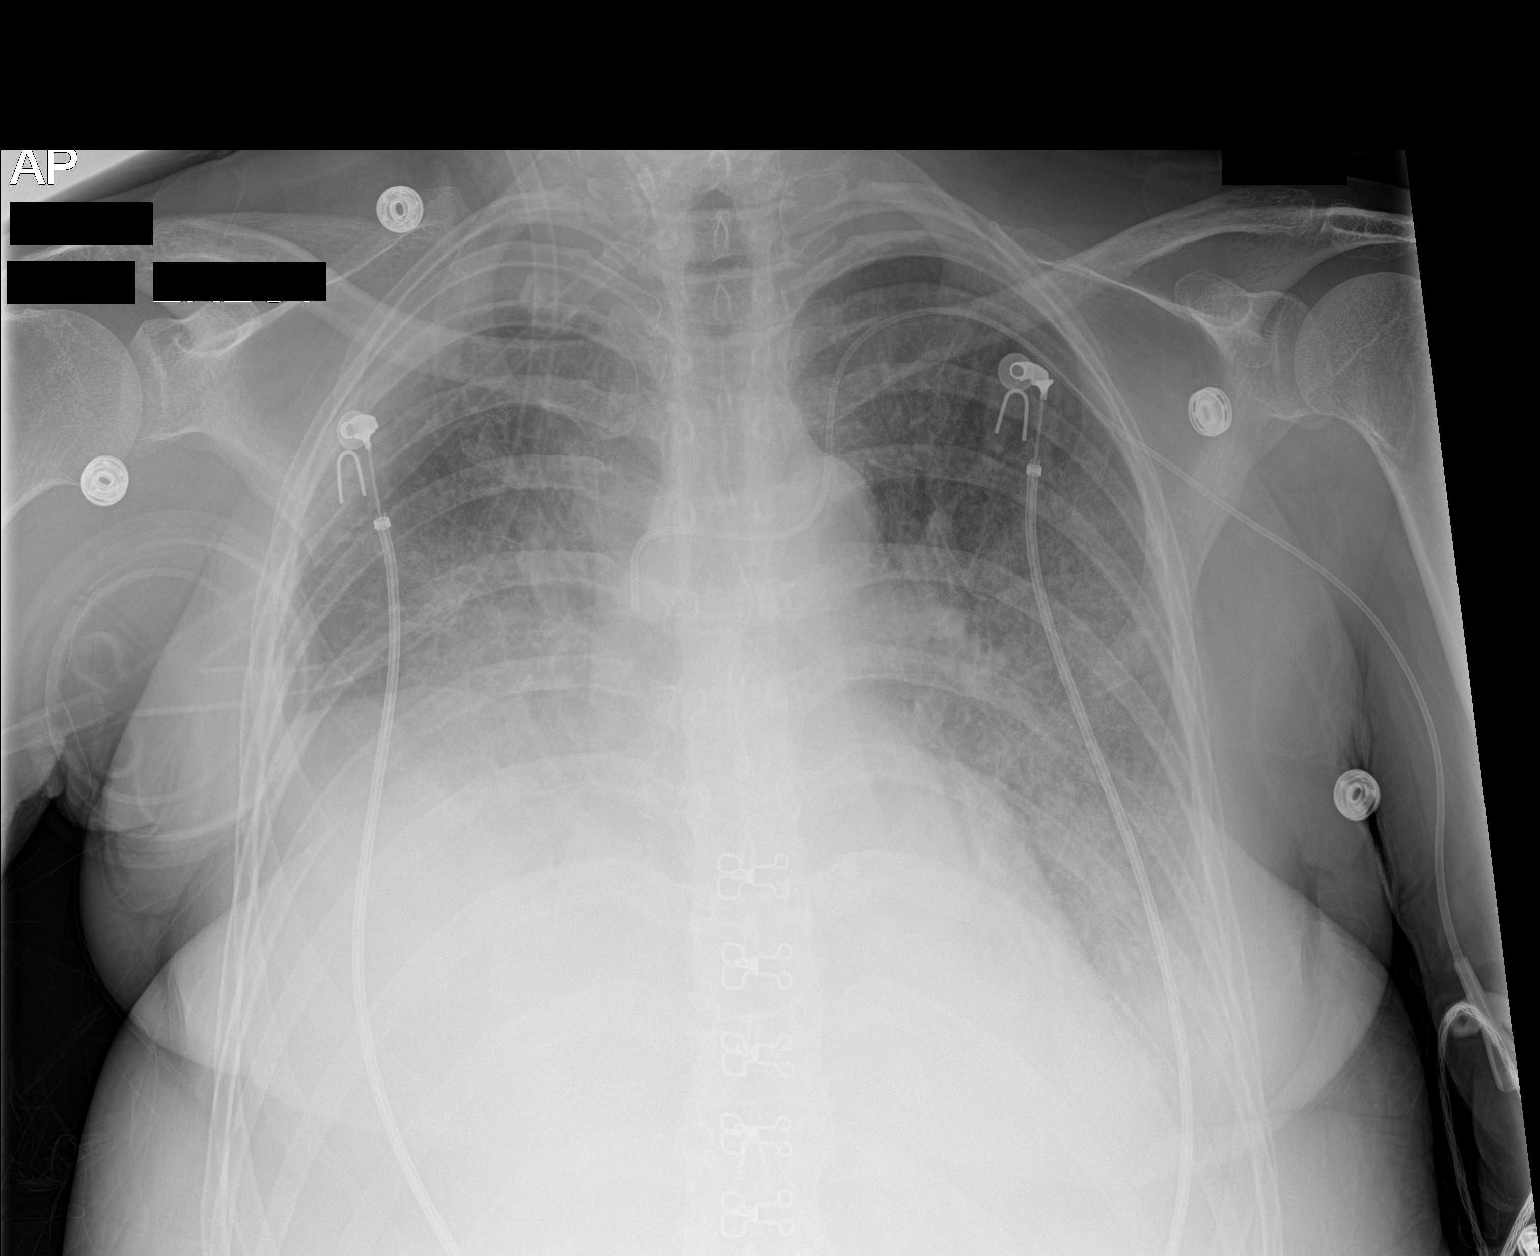

[1 of 1 positions shown; findings below may reference images not displayed]

FINDINGS: Cardiomegaly again noted. Persistent central vascular congestion and
mild interstitial edema bilaterally. Stable bilateral pleural
effusion with bilateral basilar atelectasis or infiltrate right
greater than left. Left arm PICC line is unchanged in position. No
pneumothorax.
IMPRESSION: Persistent mild congestion and pulmonary edema. Bilateral small
pleural effusion with bilateral basilar atelectasis or infiltrate.

## 2017-08-28 IMAGING — DX DG CHEST 1V PORT
1 series · 1 of 1 positions shown · non-contrast
Comparison: Date 10/17/2016

CLINICAL DATA: PULMONARY EDEMA,COUGH,CONGESTION,HTN,BREAST CA

EXAM:
PORTABLE CHEST 1 VIEW

[chest ap]
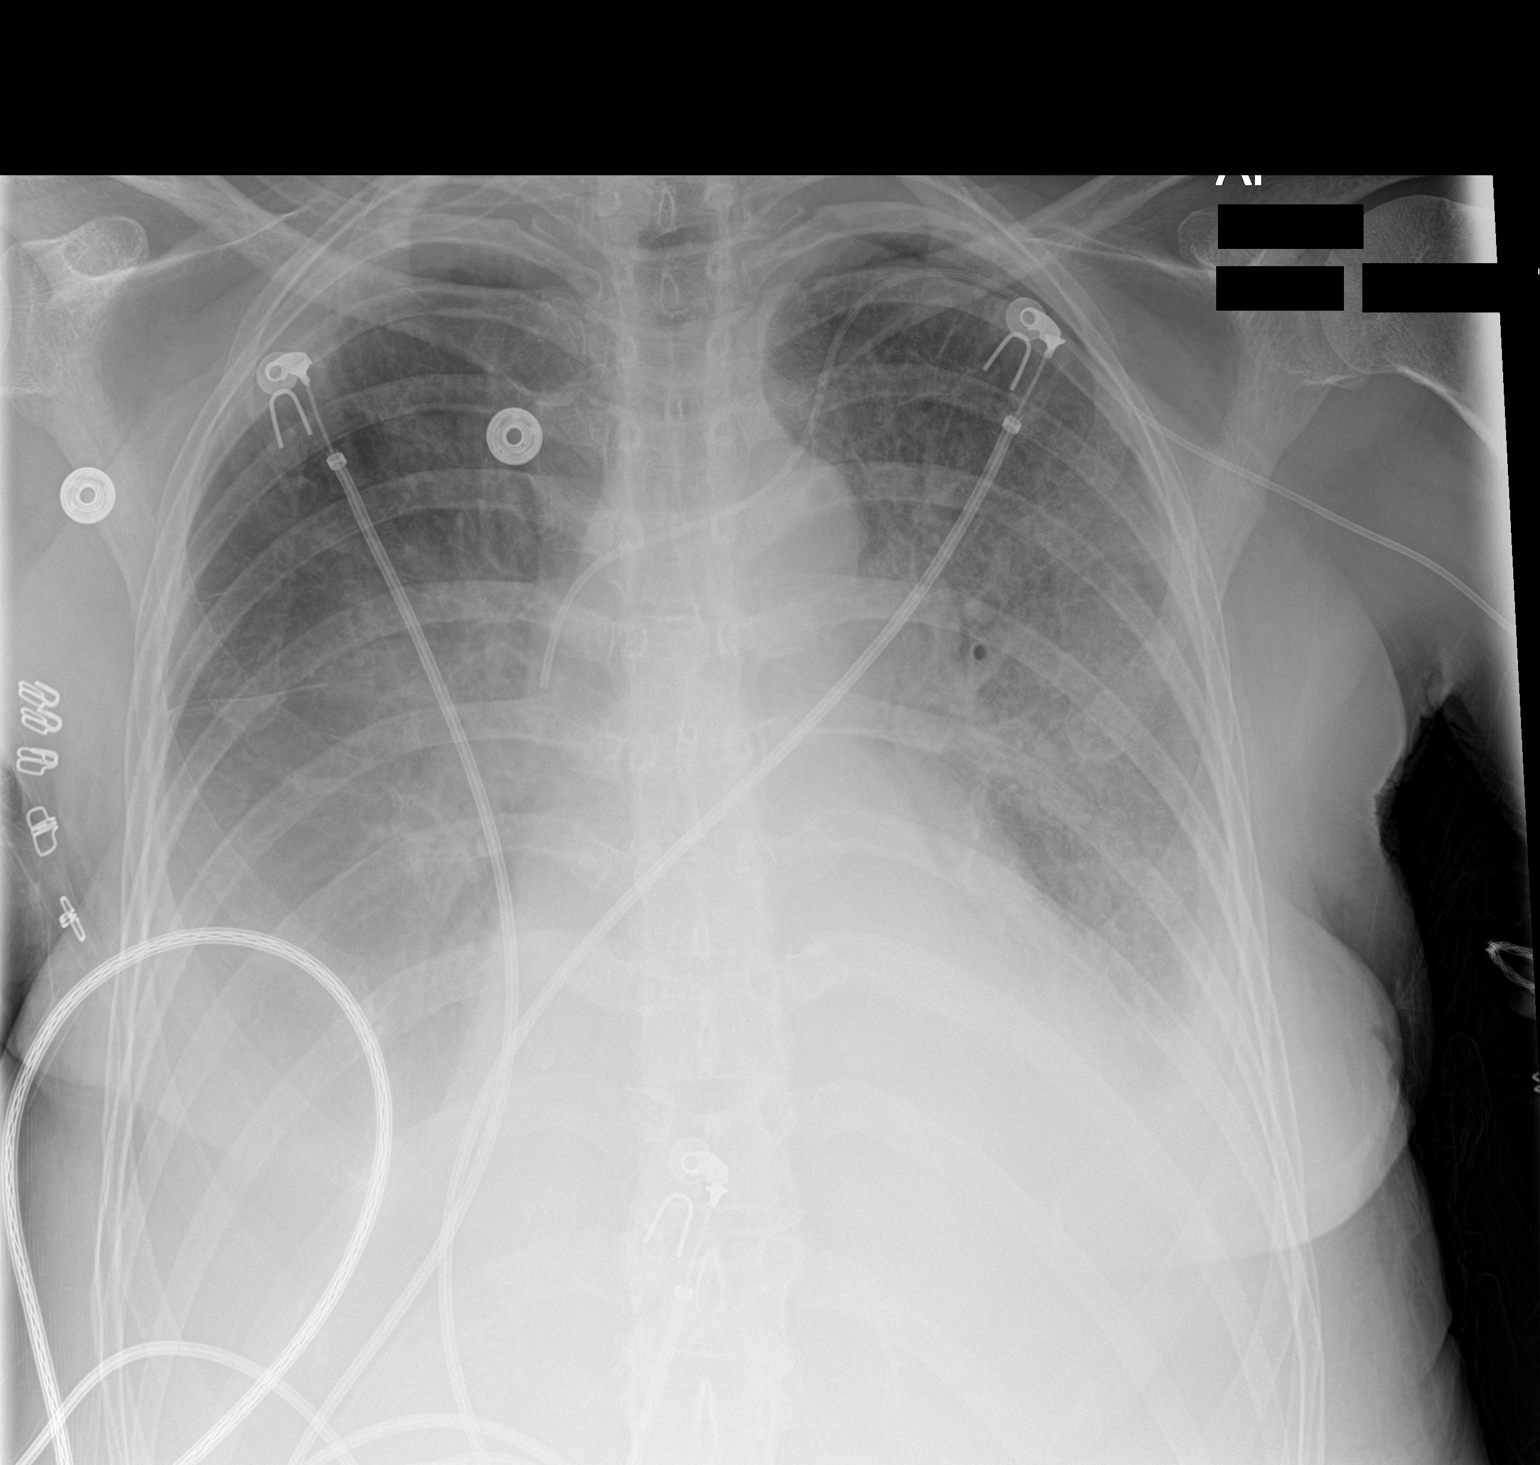

[1 of 1 positions shown; findings below may reference images not displayed]

FINDINGS: Left-sided PICC line tip overlies the level of superior vena cava.
There are bilateral pleural effusions and bibasilar opacities which
obscure the hemidiaphragms. Heart appears enlarged.
IMPRESSION: 1. Persistent cardiomegaly, airspace filling, and bilateral pleural
effusions.
2. Findings are consistent with pulmonary edema and/or infectious
process.

## 2019-03-26 DEATH — deceased
